# Patient Record
Sex: Female | Born: 1975 | Race: Black or African American | Hispanic: No | Marital: Single | Smoking: Never smoker
Health system: Southern US, Community
[De-identification: ages and names within clinical notes are randomized; demographics above are authoritative.]

---

## 2016-07-18 ENCOUNTER — Other Ambulatory Visit: Payer: Self-pay | Admitting: Obstetrics and Gynecology

## 2016-07-18 DIAGNOSIS — Z1231 Encounter for screening mammogram for malignant neoplasm of breast: Secondary | ICD-10-CM

## 2016-07-23 ENCOUNTER — Other Ambulatory Visit: Payer: Self-pay | Admitting: Orthopedic Surgery

## 2016-07-23 ENCOUNTER — Other Ambulatory Visit (HOSPITAL_COMMUNITY): Payer: Self-pay | Admitting: Orthopedic Surgery

## 2016-07-23 DIAGNOSIS — M5416 Radiculopathy, lumbar region: Secondary | ICD-10-CM

## 2016-07-30 ENCOUNTER — Ambulatory Visit (HOSPITAL_COMMUNITY): Admission: RE | Admit: 2016-07-30 | Payer: 59 | Source: Ambulatory Visit

## 2016-08-21 ENCOUNTER — Encounter: Payer: Self-pay | Admitting: Radiology

## 2016-08-21 ENCOUNTER — Ambulatory Visit
Admission: RE | Admit: 2016-08-21 | Discharge: 2016-08-21 | Disposition: A | Discharge status: Home / Self Care | Payer: 59 | Source: Ambulatory Visit | Attending: Obstetrics and Gynecology | Admitting: Obstetrics and Gynecology

## 2016-08-21 DIAGNOSIS — Z1231 Encounter for screening mammogram for malignant neoplasm of breast: Secondary | ICD-10-CM | POA: Insufficient documentation

## 2016-08-26 ENCOUNTER — Other Ambulatory Visit: Payer: Self-pay | Admitting: Obstetrics and Gynecology

## 2016-08-26 DIAGNOSIS — R928 Other abnormal and inconclusive findings on diagnostic imaging of breast: Secondary | ICD-10-CM

## 2016-08-26 DIAGNOSIS — N6489 Other specified disorders of breast: Secondary | ICD-10-CM

## 2016-09-12 ENCOUNTER — Ambulatory Visit: Payer: 59

## 2016-09-12 ENCOUNTER — Other Ambulatory Visit: Payer: 59

## 2016-09-23 ENCOUNTER — Other Ambulatory Visit: Payer: 59

## 2016-09-23 ENCOUNTER — Ambulatory Visit: Payer: 59 | Attending: Obstetrics and Gynecology

## 2017-10-08 IMAGING — MG MM DIGITAL SCREENING BILAT W/ CAD
4 series · 4 of 4 positions shown · non-contrast
Comparison: None.

CLINICAL DATA: Screening. Baseline exam

EXAM:
DIGITAL SCREENING BILATERAL MAMMOGRAM WITH CAD

[R MLO (1 of 2)]
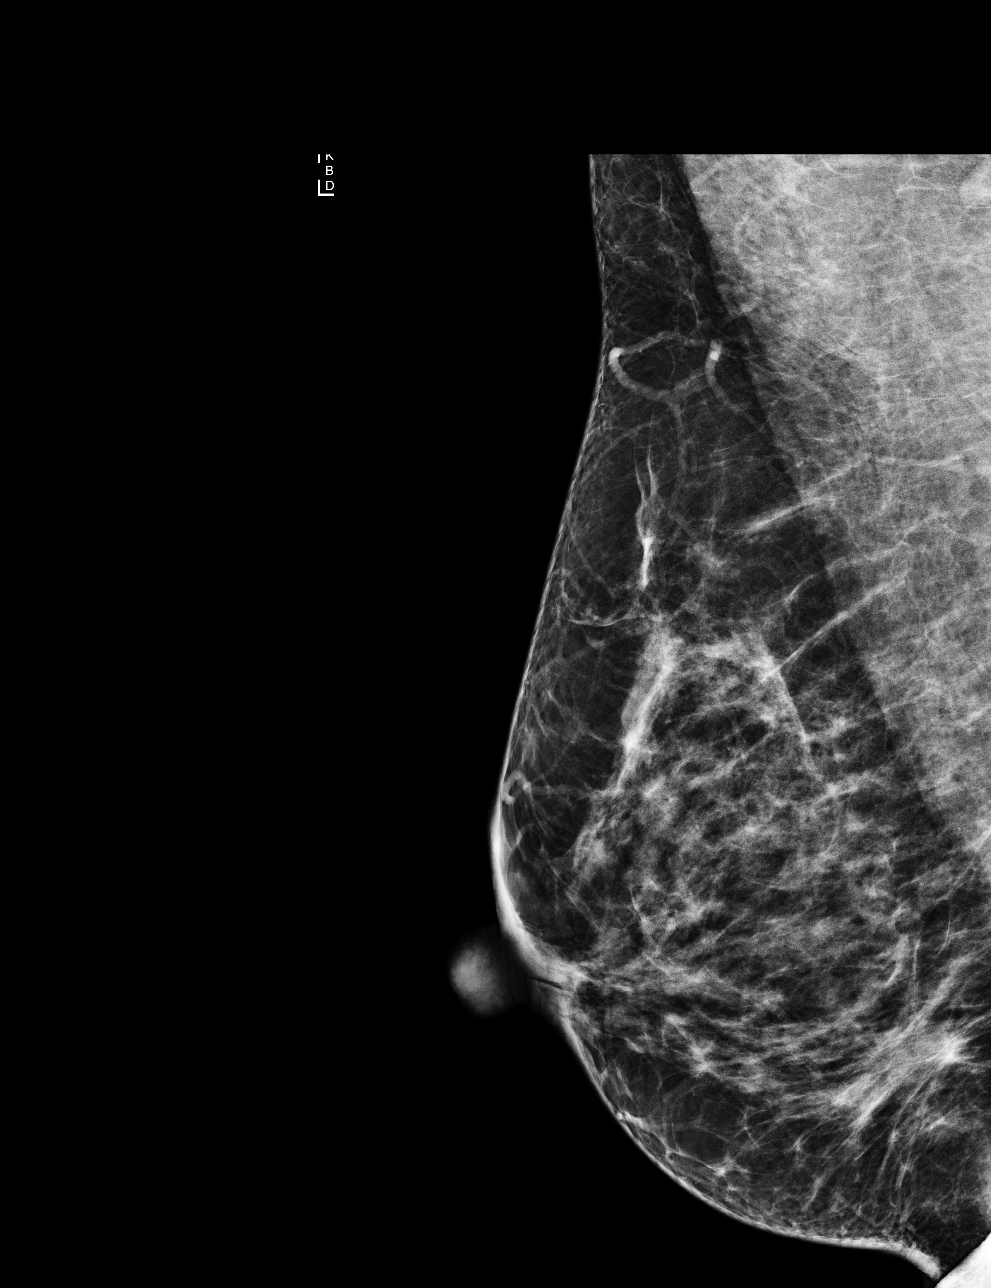

[R CC]
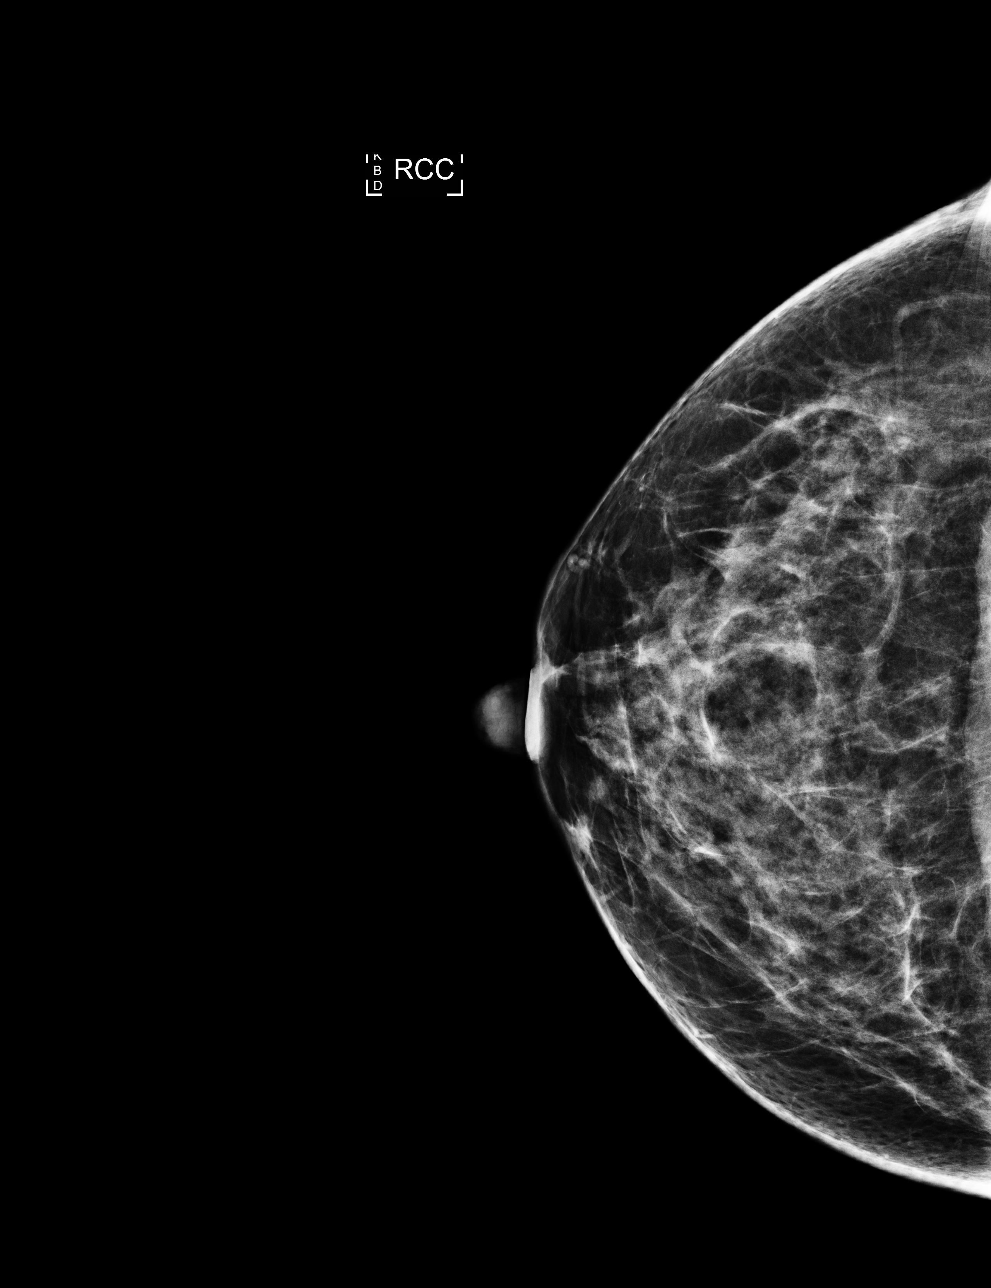

[L MLO]
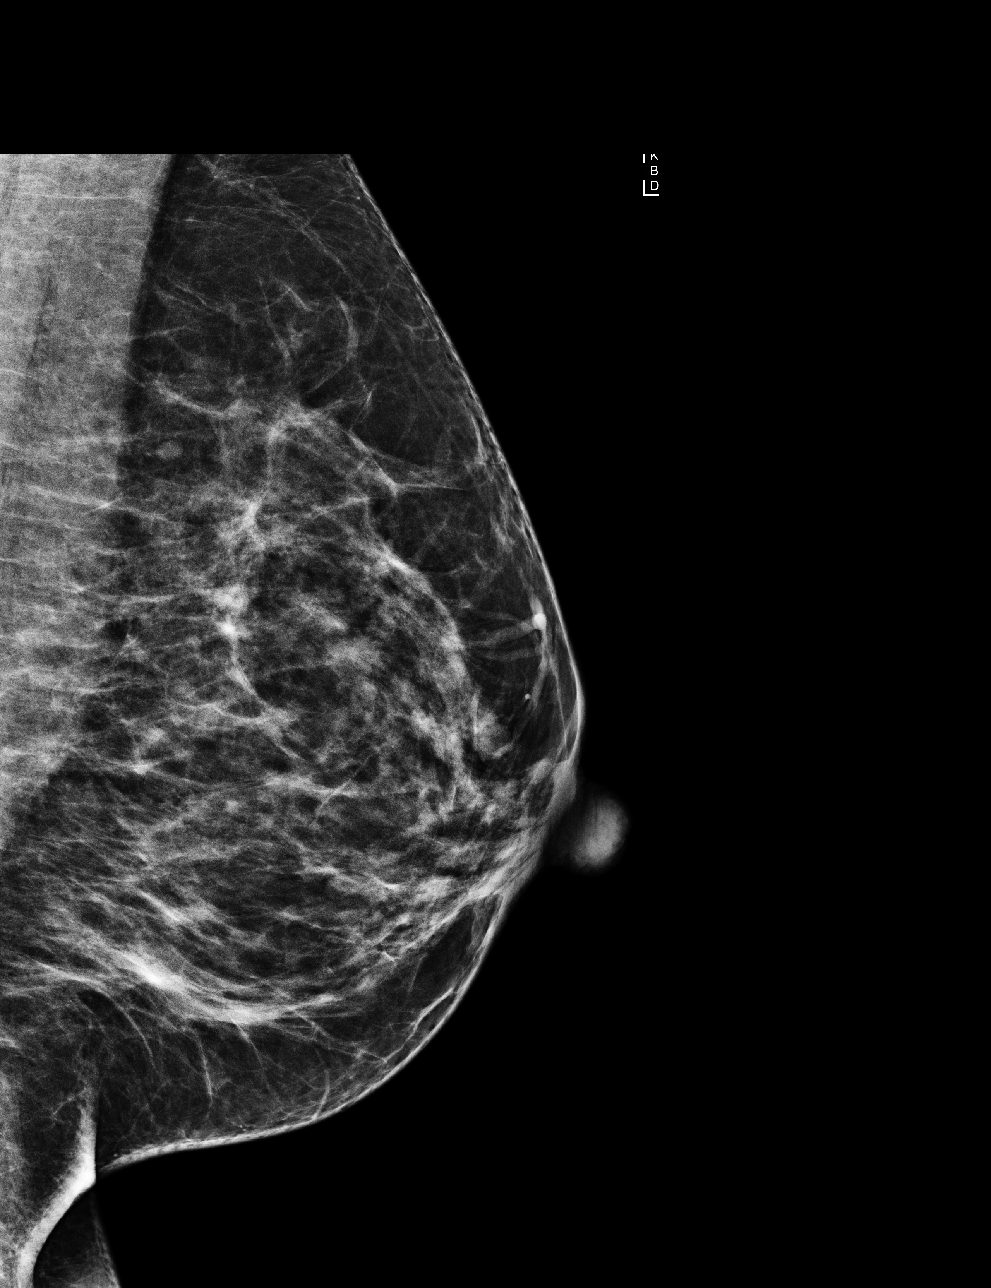

[R MLO (2 of 2)]
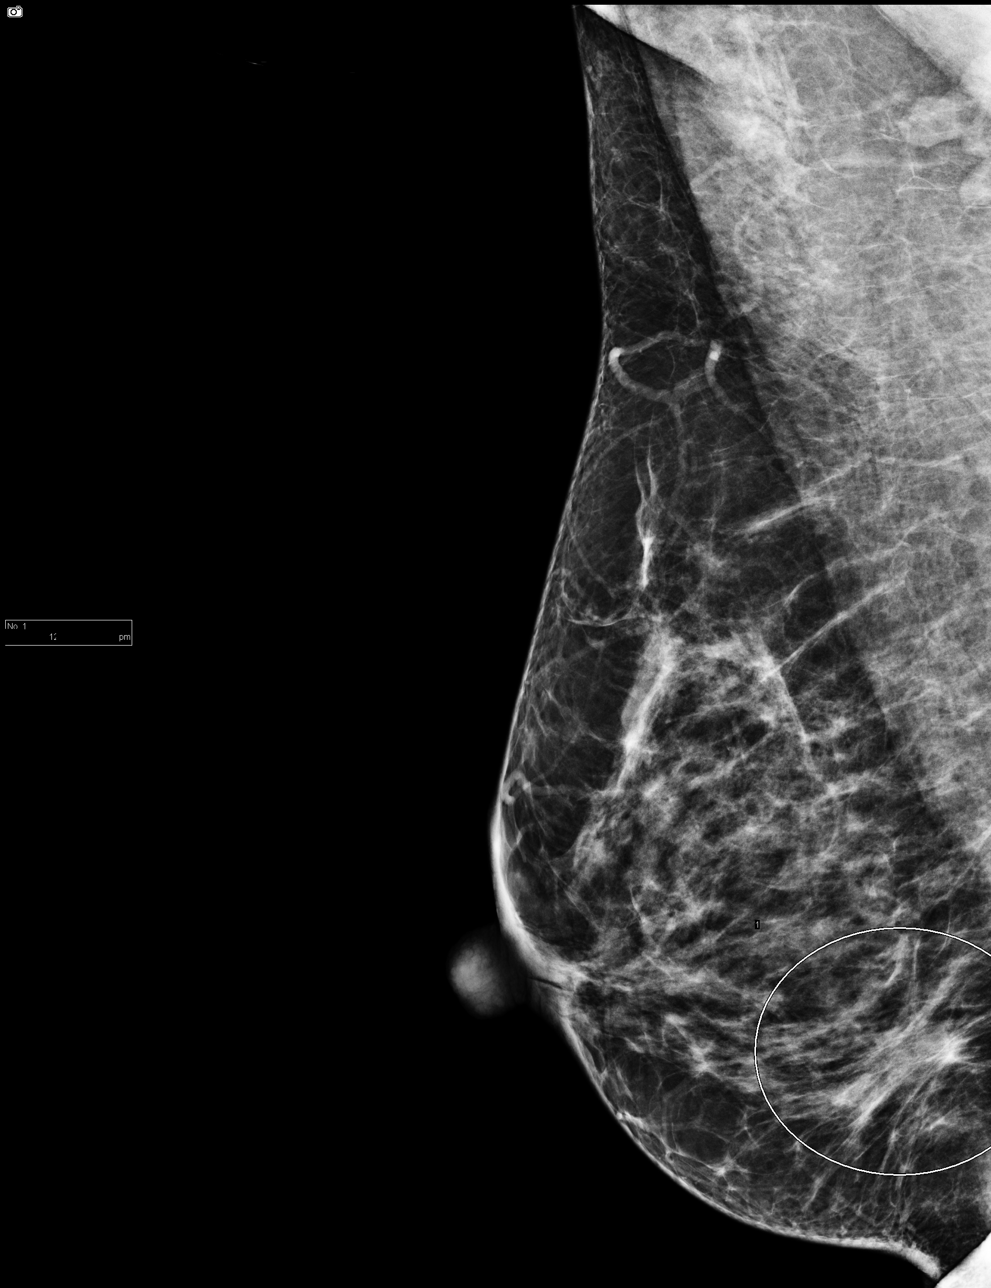

[4 of 4 positions shown; findings below may reference images not displayed]

ACR Breast Density Category c: The breast tissue is heterogeneously
dense, which may obscure small masses.
FINDINGS: In the right breast, a possible asymmetry warrants further
evaluation. In the left breast, no findings suspicious for
malignancy. Images were processed with CAD.
IMPRESSION: Further evaluation is suggested for possible asymmetry in the right
breast.

RECOMMENDATION:
Diagnostic mammogram and possibly ultrasound of the right breast.
(Code:[id number])

The patient will be contacted regarding the findings, and additional
imaging will be scheduled.

BI-RADS CATEGORY  0: Incomplete. Need additional imaging evaluation
and/or prior mammograms for comparison.

## 2018-07-06 ENCOUNTER — Ambulatory Visit
Admission: RE | Admit: 2018-07-06 | Discharge: 2018-07-06 | Disposition: A | Discharge status: Home / Self Care | Payer: Managed Care, Other (non HMO) | Source: Ambulatory Visit | Attending: Chiropractor | Admitting: Chiropractor

## 2018-07-06 ENCOUNTER — Other Ambulatory Visit: Payer: Self-pay | Admitting: Chiropractor

## 2018-07-06 DIAGNOSIS — Z041 Encounter for examination and observation following transport accident: Secondary | ICD-10-CM | POA: Insufficient documentation
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
## 2019-09-13 ENCOUNTER — Ambulatory Visit: Payer: 59 | Attending: Internal Medicine

## 2020-06-21 ENCOUNTER — Other Ambulatory Visit (HOSPITAL_COMMUNITY)
Admission: RE | Admit: 2020-06-21 | Discharge: 2020-06-21 | Disposition: A | Discharge status: Home / Self Care | Payer: Managed Care, Other (non HMO) | Source: Ambulatory Visit | Attending: Obstetrics and Gynecology | Admitting: Obstetrics and Gynecology

## 2020-06-21 ENCOUNTER — Other Ambulatory Visit: Payer: Self-pay

## 2020-06-21 ENCOUNTER — Encounter: Payer: Self-pay | Admitting: Obstetrics and Gynecology

## 2020-06-21 ENCOUNTER — Ambulatory Visit (INDEPENDENT_AMBULATORY_CARE_PROVIDER_SITE_OTHER): Payer: Managed Care, Other (non HMO) | Admitting: Obstetrics and Gynecology

## 2020-06-21 VITALS — BP 126/86 | HR 88 | Wt 196.8 lb

## 2020-06-21 DIAGNOSIS — Z01419 Encounter for gynecological examination (general) (routine) without abnormal findings: Secondary | ICD-10-CM | POA: Diagnosis not present

## 2020-06-21 DIAGNOSIS — Z124 Encounter for screening for malignant neoplasm of cervix: Secondary | ICD-10-CM | POA: Diagnosis present

## 2020-06-21 DIAGNOSIS — R140 Abdominal distension (gaseous): Secondary | ICD-10-CM

## 2020-06-21 DIAGNOSIS — R635 Abnormal weight gain: Secondary | ICD-10-CM

## 2020-06-21 NOTE — Progress Notes (Signed)
HPI:      [name] is a 44 y.o. No obstetric history on file. who LMP was No LMP recorded.  Subjective:   She presents today for her annual examination.  She says she has not had an annual examination or Pap smear in more than 3 years.  She reports that over the last year she has gained 40 pounds but reports that she has not changed her activity or her diet during that time.  She also complains of abdominal bloating and occasional pelvic discomfort.  She has no pain with intercourse menses bowel movements or urination. She is using condoms for birth control and having normal regular monthly menses.    Hx: The following portions of the patient's history were reviewed and updated as appropriate:             She  has no past medical history on file. She does not have a problem list on file. She  has no past surgical history on file. Her family history includes AAA (abdominal aortic aneurysm) in her mother; Breast cancer in her paternal aunt, paternal aunt, and paternal grandmother. She  reports that she has never smoked. She has never used smokeless tobacco. She reports current alcohol use. She reports that she does not use drugs. She has a current medication list which includes the following prescription(s): aspirin ec, cyanocobalamin, multi-vitamin, and vitamin e. She has No Known Allergies.       Review of Systems:  Review of Systems  Constitutional: Denied constitutional symptoms, night sweats, recent illness, fatigue, fever, insomnia and weight loss.  Eyes: Denied eye symptoms, eye pain, photophobia, vision change and visual disturbance.  Ears/Nose/Throat/Neck: Denied ear, nose, throat or neck symptoms, hearing loss, nasal discharge, sinus congestion and sore throat.  Cardiovascular: Denied cardiovascular symptoms, arrhythmia, chest pain/pressure, edema, exercise intolerance, orthopnea and palpitations.  Respiratory: Denied pulmonary symptoms, asthma, pleuritic pain, productive  sputum, cough, dyspnea and wheezing.  Gastrointestinal: Denied, gastro-esophageal reflux, melena, nausea and vomiting.  Genitourinary: See HPI for additional information.  Musculoskeletal: Denied musculoskeletal symptoms, stiffness, swelling, muscle weakness and myalgia.  Dermatologic: Denied dermatology symptoms, rash and scar.  Neurologic: Denied neurology symptoms, dizziness, headache, neck pain and syncope.  Psychiatric: Denied psychiatric symptoms, anxiety and depression.  Endocrine: Denied endocrine symptoms including hot flashes and night sweats.   Meds:   Current Outpatient Medications on File Prior to Visit  Medication Sig Dispense Refill  . aspirin EC 81 MG tablet Take 81 mg by mouth daily. Swallow whole.    . cyanocobalamin 100 MCG tablet Take by mouth.    . Multiple Vitamin (MULTI-VITAMIN) tablet Take 1 tablet by mouth daily.    . vitamin E 180 MG (400 UNITS) capsule Take by mouth.     No current facility-administered medications on file prior to visit.       Upstream - 06/21/20 1116      Pregnancy Intention Screening   Does the patient want to become pregnant in the next year? No    Does the patient's partner want to become pregnant in the next year? No    Would the patient like to discuss contraceptive options today? No      Contraception Wrap Up   Current Method No Contraceptive Precautions    End Method No Contraception Precautions    Contraception Counseling Provided Yes          The pregnancy intention screening data noted above was reviewed. Potential methods of contraception were discussed. The patient  elected to proceed with Female Condom.     Objective:     Vitals:   06/21/20 1108  BP: 126/86  Pulse: 88    Filed Weights   06/21/20 1108  Weight: 196 lb 12.8 oz (89.3 kg)              Physical examination General NAD, Conversant  HEENT Atraumatic; Op clear with mmm.  Normo-cephalic. Pupils reactive. Anicteric sclerae  Thyroid/Neck Smooth  without nodularity or enlargement. Normal ROM.  Neck Supple.  Skin No rashes, lesions or ulceration. Normal palpated skin turgor. No nodularity.  Breasts: No masses or discharge.  Symmetric.  No axillary adenopathy.  Lungs: Clear to auscultation.No rales or wheezes. Normal Respiratory effort, no retractions.  Heart: NSR.  No murmurs or rubs appreciated. No periferal edema  Abdomen: Soft.  Non-tender.  No masses.  No HSM. No hernia  Extremities: Moves all appropriately.  Normal ROM for age. No lymphadenopathy.  Neuro: Oriented to PPT.  Normal mood. Normal affect.     Pelvic:   Vulva: Normal appearance.  No lesions.  Vagina: No lesions or abnormalities noted.  Support: Normal pelvic support.  Urethra No masses tenderness or scarring.  Meatus Normal size without lesions or prolapse.  Cervix: Normal appearance.  No lesions.  Anus: Normal exam.  No lesions.  Perineum: Normal exam.  No lesions.        Bimanual   Uterus: Normal size.  Non-tender.  Mobile.  AV.  Adnexae: No masses.  Non-tender to palpation.  Cul-de-sac: Negative for abnormality.      Assessment:    No obstetric history on file. There are no problems to display for this patient.    1. Well woman exam with routine gynecological exam   2. Unexplained weight gain   3. Abdominal bloating     Nothing found on her exam to explain the increased weight gain or bloating.   Plan:            1.  Basic Screening Recommendations The basic screening recommendations for asymptomatic women were discussed with the patient during her visit.  The age-appropriate recommendations were discussed with her and the rational for the tests reviewed.  When I am informed by the patient that another primary care physician has previously obtained the age-appropriate tests and they are up-to-date, only outstanding tests are ordered and referrals given as necessary.  Abnormal results of tests will be discussed with her when all of her results are  completed.  Routine preventative health maintenance measures emphasized: Exercise/Diet/Weight control, Tobacco Warnings, Alcohol/Substance use risks and Stress Management Pap performed today -mammogram ordered -patient to return for fasting blood work specifically to include TSH 2.  Pelvic ultrasound for increased weight gain and bloating  Orders Orders Placed This Encounter  Procedures  . US PELVIS TRANSVAGINAL NON-OB (TV ONLY)  . US PELVIS (TRANSABDOMINAL ONLY)  . Hemoglobin A1c  . Lipid panel  . TSH    No orders of the defined types were placed in this encounter.           F/U  Return in about 2 weeks (around 07/05/2020).  Elonda Husky, M.D. 06/21/2020 11:52 AM

## 2020-06-21 NOTE — Addendum Note (Signed)
Addended by: Dorian Pod on: 06/21/2020 01:47 PM   Modules accepted: Orders

## 2020-06-22 ENCOUNTER — Other Ambulatory Visit: Payer: Managed Care, Other (non HMO)

## 2020-06-23 LAB — LIPID PANEL
Chol/HDL Ratio: 4.7 ratio — ABNORMAL HIGH (ref 0.0–4.4)
Cholesterol, Total: 150 mg/dL (ref 100–199)
HDL: 32 mg/dL — ABNORMAL LOW (ref >39)
LDL Chol Calc (NIH): 98 mg/dL (ref 0–99)
Triglycerides: 110 mg/dL (ref 0–149)
VLDL Cholesterol Cal: 20 mg/dL (ref 5–40)

## 2020-06-23 LAB — TSH: TSH: 5.28 u[IU]/mL — ABNORMAL HIGH (ref 0.450–4.500)

## 2020-06-23 LAB — HEMOGLOBIN A1C
Est. average glucose Bld gHb Est-mCnc: 111 mg/dL
Hgb A1c MFr Bld: 5.5 % (ref 4.8–5.6)

## 2020-06-26 ENCOUNTER — Ambulatory Visit (INDEPENDENT_AMBULATORY_CARE_PROVIDER_SITE_OTHER): Payer: Managed Care, Other (non HMO)

## 2020-06-26 ENCOUNTER — Other Ambulatory Visit: Payer: Self-pay

## 2020-06-26 ENCOUNTER — Telehealth: Payer: Self-pay

## 2020-06-26 DIAGNOSIS — R635 Abnormal weight gain: Secondary | ICD-10-CM | POA: Diagnosis not present

## 2020-06-26 DIAGNOSIS — R140 Abdominal distension (gaseous): Secondary | ICD-10-CM

## 2020-06-26 LAB — CYTOLOGY - PAP
Comment: NEGATIVE
Diagnosis: NEGATIVE
Diagnosis: REACTIVE
High risk HPV: NEGATIVE

## 2020-06-26 NOTE — Telephone Encounter (Signed)
Pt was checking out she said that [place] needs a bilateral breast ultrasound  Order sent over for this pt.

## 2020-06-27 ENCOUNTER — Telehealth: Payer: Self-pay

## 2020-06-27 NOTE — Telephone Encounter (Signed)
Patient informed of test results per Dr Logan Bores- appointment for thyroid panel scheduled for 06/28/20 @ 815.

## 2020-06-28 ENCOUNTER — Other Ambulatory Visit: Payer: Self-pay

## 2020-06-28 ENCOUNTER — Other Ambulatory Visit: Payer: Managed Care, Other (non HMO)

## 2020-06-28 DIAGNOSIS — R7989 Other specified abnormal findings of blood chemistry: Secondary | ICD-10-CM

## 2020-06-29 LAB — THYROID PANEL WITH TSH
Free Thyroxine Index: 1.9 (ref 1.2–4.9)
T3 Uptake Ratio: 27 % (ref 24–39)
T4, Total: 7.2 ug/dL (ref 4.5–12.0)
TSH: 2.66 u[IU]/mL (ref 0.450–4.500)

## 2020-07-04 ENCOUNTER — Other Ambulatory Visit: Payer: Self-pay | Admitting: Obstetrics and Gynecology

## 2020-07-04 DIAGNOSIS — N6489 Other specified disorders of breast: Secondary | ICD-10-CM

## 2020-07-05 ENCOUNTER — Encounter: Payer: Self-pay | Admitting: Obstetrics and Gynecology

## 2020-07-05 ENCOUNTER — Other Ambulatory Visit: Payer: Self-pay

## 2020-07-05 ENCOUNTER — Other Ambulatory Visit: Payer: Managed Care, Other (non HMO)

## 2020-07-05 ENCOUNTER — Ambulatory Visit (INDEPENDENT_AMBULATORY_CARE_PROVIDER_SITE_OTHER): Payer: Managed Care, Other (non HMO) | Admitting: Obstetrics and Gynecology

## 2020-07-05 VITALS — BP 115/80 | HR 67 | Ht 68.0 in | Wt 197.8 lb

## 2020-07-05 DIAGNOSIS — R635 Abnormal weight gain: Secondary | ICD-10-CM

## 2020-07-05 DIAGNOSIS — R140 Abdominal distension (gaseous): Secondary | ICD-10-CM

## 2020-07-05 NOTE — Progress Notes (Signed)
HPI:      Mariah Lee is a 44 y.o. No obstetric history on file. who LMP was Patient's last menstrual period was 06/26/2020.  Subjective:   She presents today to discuss her lab work and her ultrasound results.  Her TSH was initially elevated but when the entire panel was repeated.  It was found to be normal.  Her thyroid hormone was also normal. She was complaining of increased weight gain and abdominal bloating and so she underwent an ultrasound.  See findings below.    Hx: The following portions of the patient's history were reviewed and updated as appropriate:             She  has no past medical history on file. She does not have a problem list on file. She  has no past surgical history on file. Her family history includes AAA (abdominal aortic aneurysm) in her mother; Breast cancer in her paternal aunt, paternal aunt, and paternal grandmother. She  reports that she has never smoked. She has never used smokeless tobacco. She reports current alcohol use. She reports that she does not use drugs. She has a current medication list which includes the following prescription(s): aspirin ec, cyanocobalamin, multi-vitamin, and vitamin e. She has No Known Allergies.       Review of Systems:  Review of Systems  Constitutional: Denied constitutional symptoms, night sweats, recent illness, fatigue, fever, insomnia and weight loss.  Eyes: Denied eye symptoms, eye pain, photophobia, vision change and visual disturbance.  Ears/Nose/Throat/Neck: Denied ear, nose, throat or neck symptoms, hearing loss, nasal discharge, sinus congestion and sore throat.  Cardiovascular: Denied cardiovascular symptoms, arrhythmia, chest pain/pressure, edema, exercise intolerance, orthopnea and palpitations.  Respiratory: Denied pulmonary symptoms, asthma, pleuritic pain, productive sputum, cough, dyspnea and wheezing.  Gastrointestinal: Denied, gastro-esophageal reflux, melena, nausea and vomiting.  Genitourinary:  Denied genitourinary symptoms including symptomatic vaginal discharge, pelvic relaxation issues, and urinary complaints.  Musculoskeletal: Denied musculoskeletal symptoms, stiffness, swelling, muscle weakness and myalgia.  Dermatologic: Denied dermatology symptoms, rash and scar.  Neurologic: Denied neurology symptoms, dizziness, headache, neck pain and syncope.  Psychiatric: Denied psychiatric symptoms, anxiety and depression.  Endocrine: Denied endocrine symptoms including hot flashes and night sweats.   Meds:   Current Outpatient Medications on File Prior to Visit  Medication Sig Dispense Refill  . aspirin EC 81 MG tablet Take 81 mg by mouth daily. Swallow whole.    . cyanocobalamin 100 MCG tablet Take by mouth.    . Multiple Vitamin (MULTI-VITAMIN) tablet Take 1 tablet by mouth daily.    . vitamin E 180 MG (400 UNITS) capsule Take by mouth.     No current facility-administered medications on file prior to visit.          Objective:     Vitals:   07/05/20 1042  BP: 115/80  Pulse: 67   Filed Weights   07/05/20 1042  Weight: 197 lb 12.8 oz (89.7 kg)              Ultrasound findings reviewed directly with the patient  Assessment:    No obstetric history on file. There are no problems to display for this patient.    1. Unexplained weight gain   2. Abdominal bloating     Patient has 2 small fibroids noted at ultrasound but an otherwise normal pelvic exam.  She states that she has known about the fibroids for many years and in fact they seem smaller now than when she was first diagnosed. (  She was first diagnosed during pregnancy)  She continues to have normal regular menses.   Plan:            1.  Normal ultrasound  2.  Initially abnormal TSH but a normal complete thyroid panel subsequent to that.  I have reassured her regarding the pelvic exam and thyroid testing.  There is nothing further to do at this time.  All questions answered including questions about  endometriosis and possible menopause.  Orders No orders of the defined types were placed in this encounter.   No orders of the defined types were placed in this encounter.     F/U  Return in about 1 year (around 07/05/2021) for Annual Physical. I spent 16 minutes involved in the care of this patient preparing to see the patient by obtaining and reviewing her medical history (including labs, imaging tests and prior procedures), documenting clinical information in the electronic health record (EHR), counseling and coordinating care plans, writing and sending prescriptions, ordering tests or procedures and directly communicating with the patient by discussing pertinent items from her history and physical exam as well as detailing my assessment and plan as noted above so that she has an informed understanding.  All of her questions were answered.  Elonda Husky, M.D. 07/05/2020 11:26 AM

## 2020-08-07 DIAGNOSIS — Z79899 Other long term (current) drug therapy: Secondary | ICD-10-CM | POA: Diagnosis not present

## 2020-08-07 DIAGNOSIS — K0889 Other specified disorders of teeth and supporting structures: Secondary | ICD-10-CM | POA: Diagnosis not present

## 2020-08-07 DIAGNOSIS — K0381 Cracked tooth: Secondary | ICD-10-CM | POA: Diagnosis not present

## 2020-12-13 DIAGNOSIS — R946 Abnormal results of thyroid function studies: Secondary | ICD-10-CM | POA: Diagnosis not present

## 2020-12-13 DIAGNOSIS — Z3200 Encounter for pregnancy test, result unknown: Secondary | ICD-10-CM | POA: Diagnosis not present

## 2020-12-13 DIAGNOSIS — Z3201 Encounter for pregnancy test, result positive: Secondary | ICD-10-CM | POA: Diagnosis not present

## 2021-01-05 ENCOUNTER — Emergency Department
Admission: EM | Admit: 2021-01-05 | Discharge: 2021-01-05 | Disposition: A | Discharge status: Home / Self Care | Payer: Medicaid Other | Attending: Emergency Medicine | Admitting: Emergency Medicine

## 2021-01-05 ENCOUNTER — Emergency Department: Payer: Medicaid Other

## 2021-01-05 ENCOUNTER — Encounter: Payer: Self-pay | Admitting: Emergency Medicine

## 2021-01-05 ENCOUNTER — Other Ambulatory Visit: Payer: Self-pay

## 2021-01-05 DIAGNOSIS — Z3A10 10 weeks gestation of pregnancy: Secondary | ICD-10-CM | POA: Diagnosis not present

## 2021-01-05 DIAGNOSIS — O209 Hemorrhage in early pregnancy, unspecified: Secondary | ICD-10-CM | POA: Diagnosis not present

## 2021-01-05 DIAGNOSIS — N939 Abnormal uterine and vaginal bleeding, unspecified: Secondary | ICD-10-CM | POA: Diagnosis not present

## 2021-01-05 DIAGNOSIS — O4690 Antepartum hemorrhage, unspecified, unspecified trimester: Secondary | ICD-10-CM

## 2021-01-05 DIAGNOSIS — Z7982 Long term (current) use of aspirin: Secondary | ICD-10-CM | POA: Diagnosis not present

## 2021-01-05 DIAGNOSIS — O3411 Maternal care for benign tumor of corpus uteri, first trimester: Secondary | ICD-10-CM | POA: Diagnosis not present

## 2021-01-05 DIAGNOSIS — Z3A01 Less than 8 weeks gestation of pregnancy: Secondary | ICD-10-CM | POA: Diagnosis not present

## 2021-01-05 LAB — CBC WITH DIFFERENTIAL/PLATELET
Abs Immature Granulocytes: 0.03 10*3/uL (ref 0.00–0.07)
Basophils Absolute: 0 10*3/uL (ref 0.0–0.1)
Basophils Relative: 1 %
Eosinophils Absolute: 0.2 10*3/uL (ref 0.0–0.5)
Eosinophils Relative: 3 %
HCT: 37.5 % (ref 36.0–46.0)
Hemoglobin: 12.2 g/dL (ref 12.0–15.0)
Immature Granulocytes: 0 %
Lymphocytes Relative: 26 %
Lymphs Abs: 1.9 10*3/uL (ref 0.7–4.0)
MCH: 29 pg (ref 26.0–34.0)
MCHC: 32.5 g/dL (ref 30.0–36.0)
MCV: 89.3 fL (ref 80.0–100.0)
Monocytes Absolute: 0.5 10*3/uL (ref 0.1–1.0)
Monocytes Relative: 7 %
Neutro Abs: 4.5 10*3/uL (ref 1.7–7.7)
Neutrophils Relative %: 63 %
Platelets: 335 10*3/uL (ref 150–400)
RBC: 4.2 MIL/uL (ref 3.87–5.11)
RDW: 14.6 % (ref 11.5–15.5)
WBC: 7.2 10*3/uL (ref 4.0–10.5)
nRBC: 0 % (ref 0.0–0.2)

## 2021-01-05 LAB — RHOGAM INJECTION

## 2021-01-05 LAB — HCG, QUANTITATIVE, PREGNANCY: hCG, Beta Chain, Quant, S: 15007 m[IU]/mL — ABNORMAL HIGH (ref <5)

## 2021-01-05 LAB — BASIC METABOLIC PANEL
Anion gap: 8 (ref 5–15)
BUN: 11 mg/dL (ref 6–20)
CO2: 25 mmol/L (ref 22–32)
Calcium: 9.1 mg/dL (ref 8.9–10.3)
Chloride: 102 mmol/L (ref 98–111)
Creatinine, Ser: 0.82 mg/dL (ref 0.44–1.00)
GFR, Estimated: >60 mL/min (ref >60)
Glucose, Bld: 88 mg/dL (ref 70–99)
Potassium: 3.5 mmol/L (ref 3.5–5.1)
Sodium: 135 mmol/L (ref 135–145)

## 2021-01-05 LAB — ABO/RH: ABO/RH(D): O NEG

## 2021-01-05 LAB — WET PREP, GENITAL
Clue Cells Wet Prep HPF POC: NONE SEEN
Sperm: NONE SEEN
Trich, Wet Prep: NONE SEEN
Yeast Wet Prep HPF POC: NONE SEEN

## 2021-01-05 LAB — CHLAMYDIA/NGC RT PCR (ARMC ONLY)
Chlamydia Tr: NOT DETECTED
N gonorrhoeae: NOT DETECTED

## 2021-01-05 LAB — SAMPLE TO BLOOD BANK

## 2021-01-05 LAB — ANTIBODY SCREEN: Antibody Screen: NEGATIVE

## 2021-01-05 NOTE — ED Notes (Signed)
Patient transported to Ultrasound 

## 2021-01-05 NOTE — ED Notes (Signed)
Pelvic exam by Dr. Larinda Buttery with this RN at bedside at this time.

## 2021-01-05 NOTE — Discharge Instructions (Signed)
Please schedule follow-up with your OB/GYN at [facility] for repeat ultrasound in the next 10 to 14 days.  If you have worsening bleeding or significant pain, you may return to the ER for reevaluation.

## 2021-01-05 NOTE — ED Triage Notes (Signed)
Pt to ED via POV stating that she is [redacted] weeks pregnant. Pt stated having some vaginal bleeding yesterday. Pt states that the blood is pink, pt is not passing any blood clots, pt states that she did have some cramping in her lower back yesterday but that has since resolved. Pt is in NAD at this time. Pt is G5P1

## 2021-01-05 NOTE — ED Provider Notes (Signed)
[facility] Emergency Department Provider Note   ____________________________________________   Event Date/Time   First MD Initiated Contact with Patient 01/05/21 1109     (approximate)  I have reviewed the triage vital signs and the nursing notes.   HISTORY  Chief Complaint Vaginal Bleeding    HPI Mariah Lee is a 45 y.o. female, G5 P1031 at approximately 10 weeks of pregnancy who presents to the ED complaining of vaginal bleeding.  Patient reports that she started to notice some light pink vaginal bleeding yesterday with some crampy discomfort in her lower back.  Pain has resolved and she denies any pain in her abdomen, but she continues to deal with light bleeding.  She reports having to change her panty liner every 3 hours, but she has not noticed the passage of any clots or tissue.  She has not yet seen an OB/GYN this pregnancy, does have an ultrasound scheduled at [facility] for next week.  She denies any fevers, dysuria, flank pain, chest pain, or shortness of breath.        History reviewed. No pertinent past medical history.  There are no problems to display for this patient.   History reviewed. No pertinent surgical history.  Prior to Admission medications   Medication Sig Start Date End Date Taking? Authorizing Provider  Multiple Vitamin (MULTI-VITAMIN) tablet Take 1 tablet by mouth daily.   Yes [provider]  vitamin E 180 MG (400 UNITS) capsule Take by mouth.   Yes [provider]  aspirin EC 81 MG tablet Take 81 mg by mouth daily. Swallow whole.    [provider]  cyanocobalamin 100 MCG tablet Take by mouth.    [provider]    Allergies Ciprofloxacin and Clindamycin  Family History  Problem Relation Age of Onset  . Breast cancer Paternal Aunt   . Breast cancer Paternal Grandmother   . Breast cancer Paternal Aunt   . AAA (abdominal aortic aneurysm) Mother     Social History Social History    Tobacco Use  . Smoking status: Never Smoker  . Smokeless tobacco: Never Used  Substance Use Topics  . Alcohol use: Not Currently  . Drug use: Never    Review of Systems  Constitutional: No fever/chills Eyes: No visual changes. ENT: No sore throat. Cardiovascular: Denies chest pain. Respiratory: Denies shortness of breath. Gastrointestinal: No abdominal pain.  No nausea, no vomiting.  No diarrhea.  No constipation. Genitourinary: Negative for dysuria.  Positive for vaginal bleeding. Musculoskeletal: Negative for back pain. Skin: Negative for rash. Neurological: Negative for headaches, focal weakness or numbness.  ____________________________________________   PHYSICAL EXAM:  VITAL SIGNS: ED Triage Vitals  Enc Vitals Group     BP 01/05/21 0941 110/68     Pulse Rate 01/05/21 0941 78     Resp 01/05/21 0941 16     Temp 01/05/21 0941 98.7 F (37.1 C)     Temp Source 01/05/21 0941 Oral     SpO2 01/05/21 0941 100 %     Weight 01/05/21 0942 200 lb (90.7 kg)     Height 01/05/21 0942 5\' 8"  (1.727 m)     Head Circumference --      Peak Flow --      Pain Score 01/05/21 0942 0     Pain Loc --      Pain Edu? --      Excl. in GC? --     Constitutional: Alert and oriented. Eyes: Conjunctivae are normal.  Head: Atraumatic. Nose: No congestion/rhinnorhea. Mouth/Throat: Mucous membranes are moist. Neck: Normal ROM Cardiovascular: Normal rate, regular rhythm. Grossly normal heart sounds. Respiratory: Normal respiratory effort.  No retractions. Lungs CTAB. Gastrointestinal: Soft and nontender. No distention. Genitourinary: Closed cervical os with scant bleeding, no cervical motion or adnexal tenderness. Musculoskeletal: No lower extremity tenderness nor edema. Neurologic:  Normal speech and language. No gross focal neurologic deficits are appreciated. Skin:  Skin is warm, dry and intact. No rash noted. Psychiatric: Mood and affect are normal. Speech and behavior are  normal.  ____________________________________________   LABS (all labs ordered are listed, but only abnormal results are displayed)  Labs Reviewed  WET PREP, GENITAL - Abnormal; Notable for the following components:      Result Value   WBC, Wet Prep HPF POC FEW (*)    All other components within normal limits  HCG, QUANTITATIVE, PREGNANCY - Abnormal; Notable for the following components:   hCG, Beta Chain, Quant, S 15,007 (*)    All other components within normal limits  CHLAMYDIA/NGC RT PCR ([facility] ONLY)  CBC WITH DIFFERENTIAL/PLATELET  BASIC METABOLIC PANEL  POC URINE PREG, ED  ABO/RH  SAMPLE TO BLOOD BANK  ANTIBODY SCREEN  RHOGAM INJECTION    PROCEDURES  Procedure(s) performed (including Critical Care):  Procedures   ____________________________________________   INITIAL IMPRESSION / ASSESSMENT AND PLAN / ED COURSE       45 year old female, G5 P1-0-3-1 at approximately 10 weeks of pregnancy, presents to the ED with vaginal bleeding since yesterday.  Labs thus far are reassuring, H&H is stable and no evidence of significant hemorrhage.  Patient is Rh- and we will give dose of RhoGAM.  We will further assess for ectopic pregnancy or miscarriage with ultrasound.  Pelvic exam shows scant bleeding with closed cervical os and no cervical motion or nasal tenderness.  Ultrasound shows intrauterine gestational sac with septations and not yet fully formed fetal pole, concerning for possible failed pregnancy.  Patient was notified of these findings and counseled to follow-up with her OB/GYN at [facility], she has an appointment for Wednesday of next week.  She was counseled that she will need follow-up ultrasound and she was also counseled to return to the ED for any new or worsening symptoms.  Patient agrees with plan.      ____________________________________________   FINAL CLINICAL IMPRESSION(S) / ED DIAGNOSES  Final diagnoses:  Vaginal bleeding in pregnancy      ED Discharge Orders    None       Note:  This document was prepared using Dragon voice recognition software and may include unintentional dictation errors.   Chesley Noon, MD 01/05/21 [phone]

## 2021-01-05 NOTE — ED Notes (Signed)
Patient returned to ED. Family at bedside.  

## 2021-01-05 NOTE — ED Notes (Signed)
Patient reports she is approximately [redacted] weeks pregnant. Patient reports some spotting today and yesterday. Patient denies pain, cramping, or vaginal discomfort. Patient reports she has not had her first appointment with her established OBGYN at this time.

## 2021-01-05 NOTE — ED Notes (Signed)
Pelvic cart at bedside. 

## 2021-01-05 NOTE — ED Notes (Signed)
Patient is resting comfortably. 

## 2021-01-06 LAB — RHOGAM INJECTION: Unit division: 0

## 2021-01-08 ENCOUNTER — Telehealth: Payer: Self-pay

## 2021-01-08 NOTE — Telephone Encounter (Signed)
Transition Care Management Follow-up Telephone Call  Date of discharge and from where: 004/29/2022 from [facility]  How have you been since you were released from the hospital? Pt stated that she is feeling well and has no questions at this time.   Any questions or concerns? No  Items Reviewed:  Did the pt receive and understand the discharge instructions provided? Yes   Medications obtained and verified? Yes   Other? No   Any new allergies since your discharge? No   Dietary orders reviewed? n/a  Do you have support at home? Yes   Functional Questionnaire: (I = Independent and D = Dependent) ADLs: I  Bathing/Dressing- I  Meal Prep- I  Eating- I  Maintaining continence- I  Transferring/Ambulation- I  Managing Meds- I  Follow up appointments reviewed:   PCP Hospital f/u appt confirmed? No    Specialist Hospital f/u appt confirmed? Yes  Scheduled to see Marcha Solders, MD on 01/16/2021 @ 10:30am.  Are transportation arrangements needed? No   If their condition worsens, is the pt aware to call PCP or go to the Emergency Dept.? Yes  Was the patient provided with contact information for the PCP's office or ED? Yes  Was to pt encouraged to call back with questions or concerns? Yes

## 2021-01-10 DIAGNOSIS — Z13228 Encounter for screening for other metabolic disorders: Secondary | ICD-10-CM | POA: Diagnosis not present

## 2021-01-10 DIAGNOSIS — Z13220 Encounter for screening for lipoid disorders: Secondary | ICD-10-CM | POA: Diagnosis not present

## 2021-01-10 DIAGNOSIS — Z131 Encounter for screening for diabetes mellitus: Secondary | ICD-10-CM | POA: Diagnosis not present

## 2021-01-10 DIAGNOSIS — O360111 Maternal care for anti-D [Rh] antibodies, first trimester, fetus 1: Secondary | ICD-10-CM | POA: Diagnosis not present

## 2021-01-10 DIAGNOSIS — O021 Missed abortion: Secondary | ICD-10-CM | POA: Diagnosis not present

## 2021-01-10 DIAGNOSIS — Z130 Encounter for screening for diseases of the blood and blood-forming organs and certain disorders involving the immune mechanism: Secondary | ICD-10-CM | POA: Diagnosis not present

## 2021-01-10 DIAGNOSIS — Z0183 Encounter for blood typing: Secondary | ICD-10-CM | POA: Diagnosis not present

## 2021-01-10 DIAGNOSIS — O3680X9 Pregnancy with inconclusive fetal viability, other fetus: Secondary | ICD-10-CM | POA: Diagnosis not present

## 2021-01-10 DIAGNOSIS — Z0000 Encounter for general adult medical examination without abnormal findings: Secondary | ICD-10-CM | POA: Diagnosis not present

## 2021-01-15 DIAGNOSIS — Z308 Encounter for other contraceptive management: Secondary | ICD-10-CM | POA: Diagnosis not present

## 2021-01-15 DIAGNOSIS — O039 Complete or unspecified spontaneous abortion without complication: Secondary | ICD-10-CM | POA: Diagnosis not present

## 2021-01-15 DIAGNOSIS — Z3201 Encounter for pregnancy test, result positive: Secondary | ICD-10-CM | POA: Diagnosis not present

## 2021-05-23 DIAGNOSIS — Z03818 Encounter for observation for suspected exposure to other biological agents ruled out: Secondary | ICD-10-CM | POA: Diagnosis not present

## 2021-05-23 DIAGNOSIS — Z20822 Contact with and (suspected) exposure to covid-19: Secondary | ICD-10-CM | POA: Diagnosis not present

## 2021-05-28 DIAGNOSIS — M25522 Pain in left elbow: Secondary | ICD-10-CM | POA: Diagnosis not present

## 2021-05-28 DIAGNOSIS — Z0000 Encounter for general adult medical examination without abnormal findings: Secondary | ICD-10-CM | POA: Diagnosis not present

## 2021-05-28 DIAGNOSIS — M2550 Pain in unspecified joint: Secondary | ICD-10-CM | POA: Diagnosis not present

## 2021-05-28 DIAGNOSIS — M79602 Pain in left arm: Secondary | ICD-10-CM | POA: Diagnosis not present

## 2021-05-28 DIAGNOSIS — M71552 Other bursitis, not elsewhere classified, left hip: Secondary | ICD-10-CM | POA: Diagnosis not present

## 2021-06-08 ENCOUNTER — Inpatient Hospital Stay: Admission: RE | Admit: 2021-06-08 | Payer: Medicaid Other | Source: Ambulatory Visit

## 2021-06-08 ENCOUNTER — Other Ambulatory Visit: Payer: Medicaid Other

## 2021-06-21 ENCOUNTER — Other Ambulatory Visit: Payer: Self-pay | Admitting: Family Medicine

## 2021-06-21 DIAGNOSIS — N63 Unspecified lump in unspecified breast: Secondary | ICD-10-CM

## 2021-10-02 DIAGNOSIS — R922 Inconclusive mammogram: Secondary | ICD-10-CM | POA: Diagnosis not present

## 2021-10-02 DIAGNOSIS — D241 Benign neoplasm of right breast: Secondary | ICD-10-CM | POA: Diagnosis not present

## 2021-10-02 DIAGNOSIS — R928 Other abnormal and inconclusive findings on diagnostic imaging of breast: Secondary | ICD-10-CM | POA: Diagnosis not present

## 2021-10-15 DIAGNOSIS — D249 Benign neoplasm of unspecified breast: Secondary | ICD-10-CM | POA: Diagnosis not present

## 2021-10-15 DIAGNOSIS — Z0000 Encounter for general adult medical examination without abnormal findings: Secondary | ICD-10-CM | POA: Diagnosis not present

## 2021-10-15 DIAGNOSIS — M7712 Lateral epicondylitis, left elbow: Secondary | ICD-10-CM | POA: Diagnosis not present

## 2021-11-13 DIAGNOSIS — E669 Obesity, unspecified: Secondary | ICD-10-CM | POA: Diagnosis not present

## 2021-12-02 DIAGNOSIS — Z20822 Contact with and (suspected) exposure to covid-19: Secondary | ICD-10-CM | POA: Diagnosis not present

## 2021-12-02 DIAGNOSIS — R051 Acute cough: Secondary | ICD-10-CM | POA: Diagnosis not present

## 2021-12-02 DIAGNOSIS — R070 Pain in throat: Secondary | ICD-10-CM | POA: Diagnosis not present

## 2021-12-02 DIAGNOSIS — J029 Acute pharyngitis, unspecified: Secondary | ICD-10-CM | POA: Diagnosis not present

## 2021-12-02 DIAGNOSIS — N390 Urinary tract infection, site not specified: Secondary | ICD-10-CM | POA: Diagnosis not present

## 2021-12-02 DIAGNOSIS — R300 Dysuria: Secondary | ICD-10-CM | POA: Diagnosis not present

## 2022-02-22 IMAGING — US US OB < 14 WEEKS - US OB TV
1 series · 13 of 28 positions shown · non-contrast
Comparison: None.

CLINICAL DATA: Vaginal bleeding

EXAM:
OBSTETRIC <14 WK US AND TRANSVAGINAL OB US
TECHNIQUE: Both transabdominal and transvaginal ultrasound examinations were
performed for complete evaluation of the gestation as well as the
maternal uterus, adnexal regions, and pelvic cul-de-sac.
Transvaginal technique was performed to assess early pregnancy.

[Series 1: us ob comp less 14 wks · 104 acquisitions, 13 frames shown]
[im 4/104]
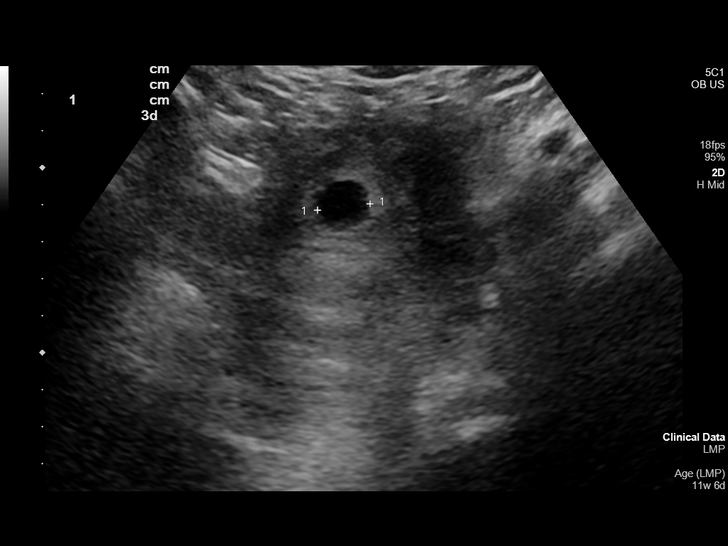
[im 12/104]
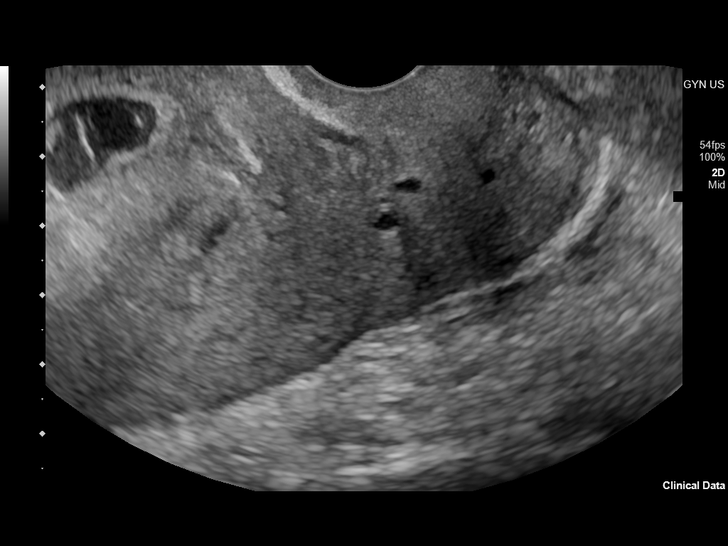
[im 20/104]
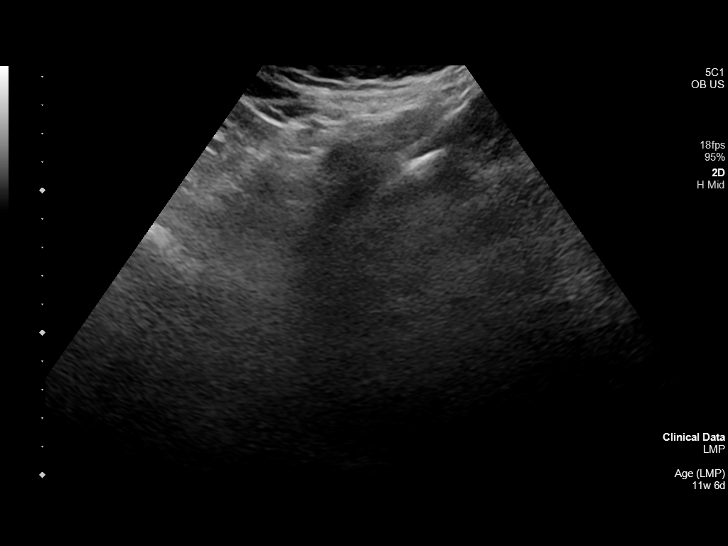
[im 27/104]
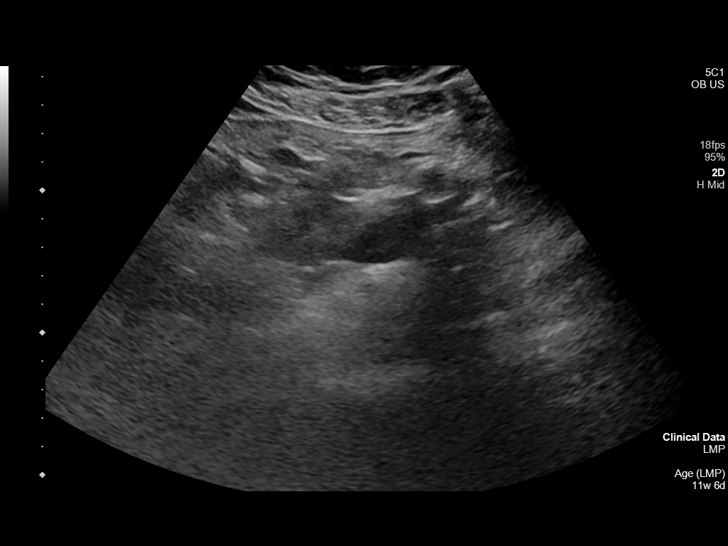
[im 35/104]
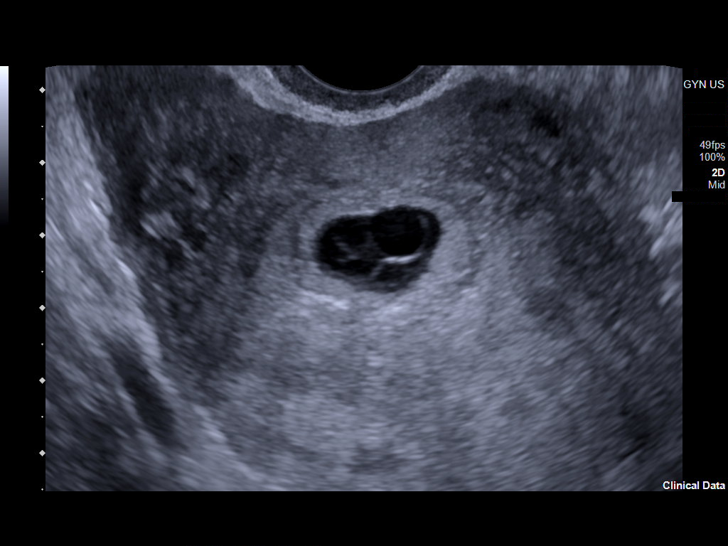
[im 42/104]
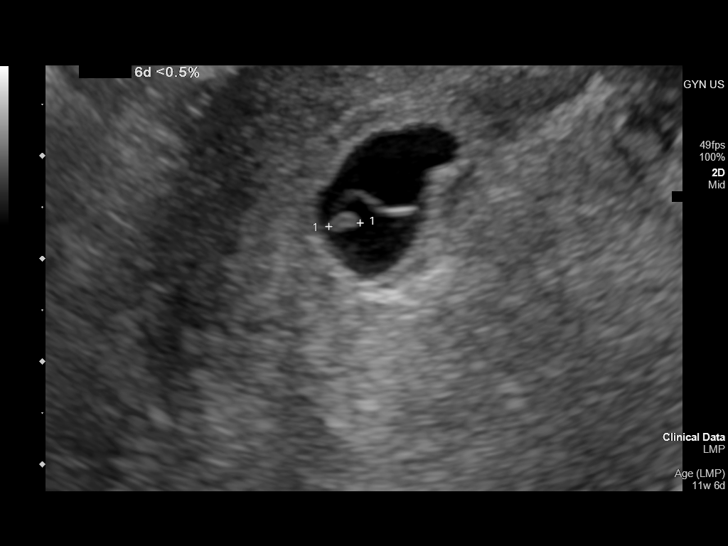
[im 54/104]
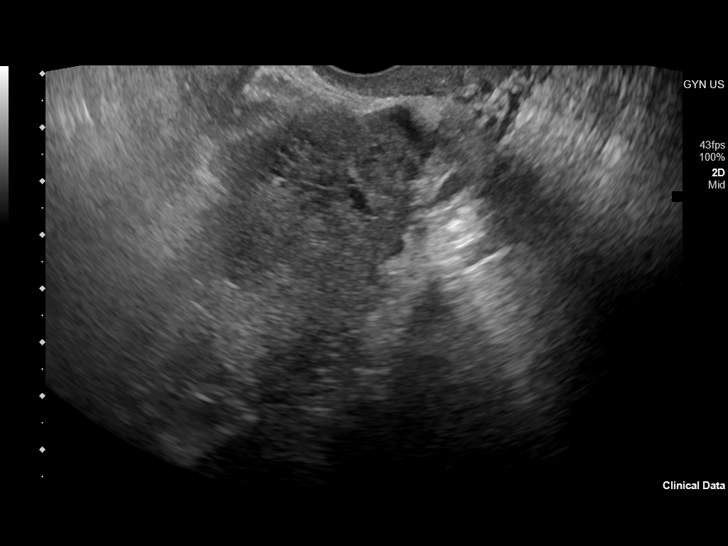
[im 62/104]
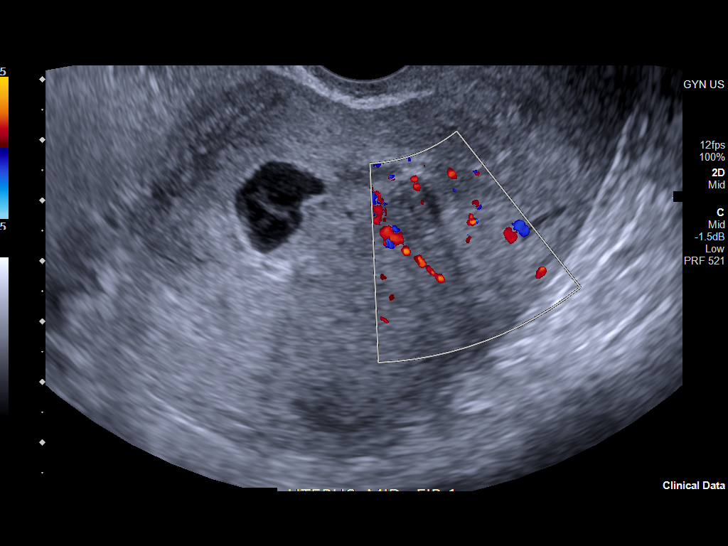
[im 69/104]
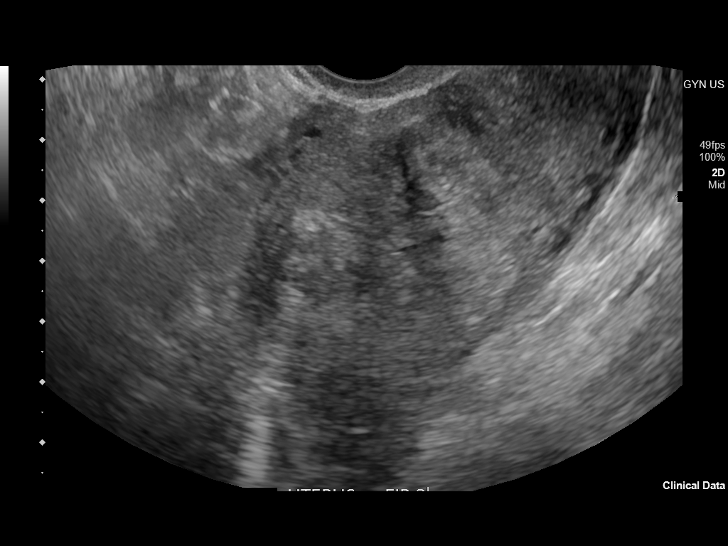
[im 77/104]
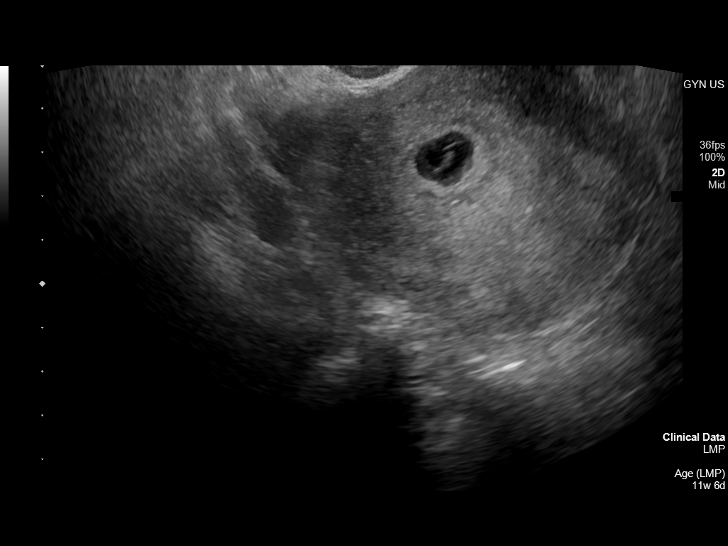
[im 84/104]
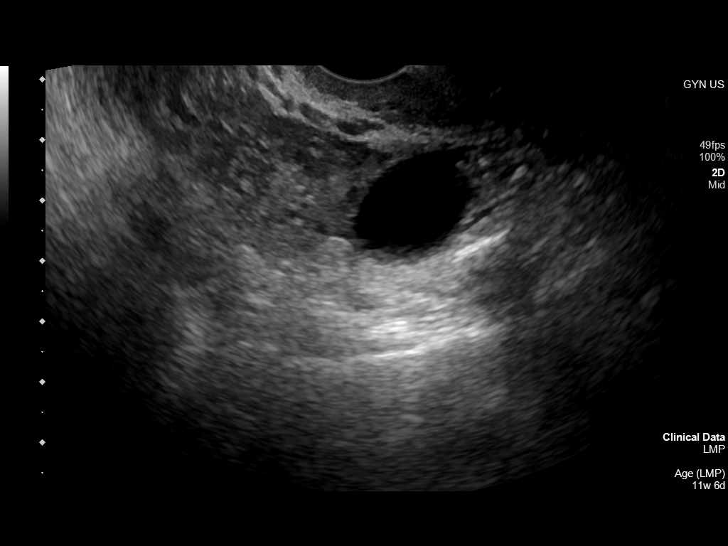
[im 92/104]
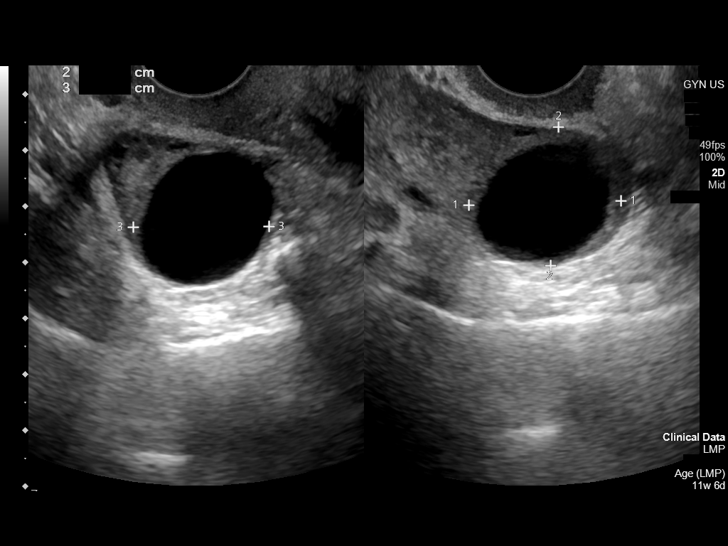
[im 100/104]
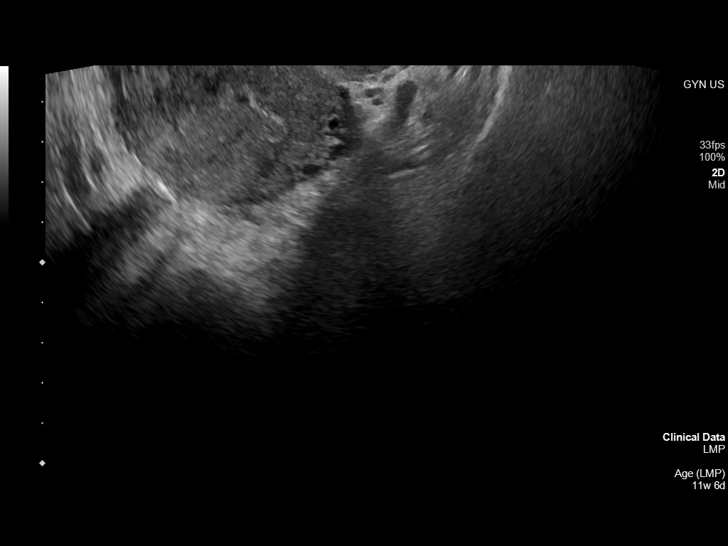

[13 of 28 positions shown; findings below may reference images not displayed]

FINDINGS: Intrauterine gestational sac: Visualized-single. Gestational sac
contains septations.

Yolk sac:  Visualized

Embryo:  Visualized

Cardiac Activity: Not visualized

CRL:  3 mm   5 w   6 d

Subchorionic hemorrhage:  None visualized.

Maternal uterus/adnexae: Leiomyomatous changes noted within the
uterus. In the mid uterus there is a 1.3 x 1.2 x 1.4 cm mixed
echogenicity apparent leiomyoma. A similar appearing apparent
leiomyomas noted in the lower uterine segment region toward the left
measuring 1.4 x 1.5 x 1.5 cm. In the mid fundal region, an apparent
leiomyoma measures 1.8 x 1.5 x 1.4 cm. Cervical os is closed.

Right ovary measures 4.9 x 2.5 x 3.3 cm. Left ovary not well seen by
transabdominal transvaginal technique. No left-sided pelvic mass
evident. There is a presumed corpus luteum on the right measuring
2.7 x 2.3 x 2.3 cm. No free pelvic fluid.
IMPRESSION: 1. There is a 3 mm apparent fetal pole without cardiac activity.
Findings are suspicious but not yet definitive for failed pregnancy.
Recommend follow-up US in 10-14 days for definitive diagnosis. This
recommendation follows SRU consensus guidelines: Diagnostic Criteria
for Nonviable Pregnancy Early in the First Trimester. N Engl J Med
7581; [DATE].

2. Septations noted within the gestational sac. This finding will
warrant close clinical and imaging surveillance.

3. Leiomyomatous uterus with several leiomyomas appreciable. The
echotexture of the liver suggests diffuse underlying leiomyomatous
change as well.

4.  No subchorionic hemorrhage evident.

## 2022-03-26 DIAGNOSIS — A084 Viral intestinal infection, unspecified: Secondary | ICD-10-CM | POA: Diagnosis not present

## 2023-04-27 DIAGNOSIS — K047 Periapical abscess without sinus: Secondary | ICD-10-CM | POA: Diagnosis not present

## 2023-05-23 DIAGNOSIS — N3000 Acute cystitis without hematuria: Secondary | ICD-10-CM | POA: Diagnosis not present

## 2023-05-23 DIAGNOSIS — J029 Acute pharyngitis, unspecified: Secondary | ICD-10-CM | POA: Diagnosis not present

## 2023-05-23 DIAGNOSIS — R8290 Unspecified abnormal findings in urine: Secondary | ICD-10-CM | POA: Diagnosis not present

## 2023-05-23 DIAGNOSIS — J069 Acute upper respiratory infection, unspecified: Secondary | ICD-10-CM | POA: Diagnosis not present

## 2023-05-30 ENCOUNTER — Ambulatory Visit (INDEPENDENT_AMBULATORY_CARE_PROVIDER_SITE_OTHER): Payer: BC Managed Care – PPO

## 2023-05-30 ENCOUNTER — Ambulatory Visit
Admission: EM | Admit: 2023-05-30 | Discharge: 2023-05-30 | Disposition: A | Discharge status: Home / Self Care | Payer: BC Managed Care – PPO

## 2023-05-30 DIAGNOSIS — R058 Other specified cough: Secondary | ICD-10-CM | POA: Diagnosis not present

## 2023-05-30 DIAGNOSIS — J069 Acute upper respiratory infection, unspecified: Secondary | ICD-10-CM

## 2023-05-30 DIAGNOSIS — R051 Acute cough: Secondary | ICD-10-CM

## 2023-05-30 MED ORDER — BENZONATATE 100 MG PO CAPS: 100.0000 mg | ORAL_CAPSULE | Freq: Three times a day (TID) | ORAL | 0 refills | Status: AC | PRN

## 2023-05-30 NOTE — ED Notes (Signed)
Appointment scheduled for patient to establish pcp.

## 2023-05-30 NOTE — ED Provider Notes (Signed)
Renaldo Fiddler    CSN: [id number] Arrival date & time: 05/30/23  1727      History   Chief Complaint Chief Complaint  Patient presents with   Cough    HPI Mariah Lee is a 47 y.o. female.  Patient presents with 1 week history of fatigue, congestion, sore throat, cough.  No fever, shortness of breath, chest pain, or other symptoms.  No OTC medications taken today.  [facility]; diagnosed with acute cystitis, sore throat, cloudy urine, viral URI; treated with Keflex; her urine color sure grew greater than 100,000 colonies of Klebsiella pneumoniae which was susceptible to the medication.  Her urine symptoms have resolved.  Patient was seen at [facility] on 04/27/2023; diagnosed with infected tooth; treated with amoxicillin and naproxen.  The history is provided by the patient and medical records.    History reviewed. No pertinent past medical history.  There are no problems to display for this patient.   History reviewed. No pertinent surgical history.  OB History     Gravida  1   Para      Term      Preterm      AB      Living         SAB      IAB      Ectopic      Multiple      Live Births               Home Medications    Prior to Admission medications   Medication Sig Start Date End Date Taking? Authorizing Provider  benzonatate (TESSALON) 100 MG capsule Take 1 capsule (100 mg total) by mouth 3 (three) times daily as needed for cough. 05/30/23  Yes Mickie Bail, NP  aspirin EC 81 MG tablet Take 81 mg by mouth daily. Swallow whole. Patient not taking: Reported on 05/30/2023    [provider]  cephALEXin (KEFLEX) 500 MG capsule Take by mouth. Patient not taking: Reported on 05/30/2023 05/23/23 05/30/23  [provider]  cyanocobalamin 100 MCG tablet Take by mouth.    [provider]  Multiple Vitamin (MULTI-VITAMIN) tablet Take 1 tablet by mouth daily.    [provider]   vitamin E 180 MG (400 UNITS) capsule Take by mouth.    [provider]    Family History Family History  Problem Relation Age of Onset   Breast cancer Paternal Aunt    Breast cancer Paternal Grandmother    Breast cancer Paternal Aunt    AAA (abdominal aortic aneurysm) Mother     Social History Social History   Tobacco Use   Smoking status: Never   Smokeless tobacco: Never  Substance Use Topics   Alcohol use: Not Currently   Drug use: Never     Allergies   Ciprofloxacin, Clindamycin, and Keflex [cephalexin]   Review of Systems Review of Systems  Constitutional:  Positive for fatigue. Negative for chills and fever.  HENT:  Positive for congestion and sore throat. Negative for ear pain.   Respiratory:  Positive for cough. Negative for shortness of breath.   Cardiovascular:  Negative for chest pain and palpitations.  Gastrointestinal:  Negative for abdominal pain, diarrhea and vomiting.  Genitourinary:  Negative for dysuria and hematuria.     Physical Exam Triage Vital Signs ED Triage Vitals  Encounter Vitals Group     BP 05/30/23 1801 (!) 130/91     Systolic  BP Percentile --      Diastolic BP Percentile --      Pulse Rate 05/30/23 1753 83     Resp 05/30/23 1753 18     Temp 05/30/23 1753 98.8 F (37.1 C)     Temp src --      SpO2 05/30/23 1753 99 %     Weight --      Height --      Head Circumference --      Peak Flow --      Pain Score 05/30/23 1758 5     Pain Loc --      Pain Education --      Exclude from Growth Chart --    No data found.  Updated Vital Signs BP (!) 130/91   Pulse 83   Temp 98.8 F (37.1 C)   Resp 18   LMP 05/27/2023   SpO2 99%   Visual Acuity Right Eye Distance:   Left Eye Distance:   Bilateral Distance:    Right Eye Near:   Left Eye Near:    Bilateral Near:     Physical Exam Constitutional:      General: She is not in acute distress. HENT:     Right Ear: Tympanic membrane normal.     Left Ear: Tympanic  membrane normal.     Nose: Nose normal.     Mouth/Throat:     Mouth: Mucous membranes are moist.     Pharynx: Oropharynx is clear.  Cardiovascular:     Rate and Rhythm: Normal rate and regular rhythm.     Heart sounds: Normal heart sounds.  Pulmonary:     Effort: Pulmonary effort is normal. No respiratory distress.     Breath sounds: Normal breath sounds. No wheezing, rhonchi or rales.  Skin:    General: Skin is warm and dry.  Neurological:     Mental Status: She is alert.  Psychiatric:        Mood and Affect: Mood normal.        Behavior: Behavior normal.      UC Treatments / Results  Labs (all labs ordered are listed, but only abnormal results are displayed) Labs Reviewed - No data to display  EKG   Radiology DG Chest 2 View  Result Date: 05/30/2023 CLINICAL DATA:  Productive cough. EXAM: CHEST - 2 VIEW COMPARISON:  None Available. FINDINGS: The heart size and mediastinal contours are within normal limits. Both lungs are clear. The visualized skeletal structures are unremarkable. IMPRESSION: No active cardiopulmonary disease. Electronically Signed   By: Aram Candela M.D.   On: 05/30/2023 19:40    Procedures Procedures (including critical care time)  Medications Ordered in UC Medications - No data to display  Initial Impression / Assessment and Plan / UC Course  I have reviewed the triage vital signs and the nursing notes.  Pertinent labs & imaging results that were available during my care of the patient were reviewed by me and considered in my medical decision making (see chart for details).    Cough, viral URI.  No respiratory distress, lungs are clear, O2 sat 99% on room air.  Chest x-ray negative.  Treating cough with Tessalon Perles.  Patient does not currently have a PCP.  Our nurse scheduled her with an appointment to establish a PCP at [facility].  ED precautions given.  Education provided on cough.  Patient agrees to plan of care.  Final Clinical  Impressions(s) / UC Diagnoses  Final diagnoses:  Acute cough  Viral URI     Discharge Instructions      Take the Tessalon Perles for cough.  Follow up with your primary care provider on Monday.  Go to the emergency department if you have worsening symptoms.        ED Prescriptions     Medication Sig Dispense Auth. Provider   benzonatate (TESSALON) 100 MG capsule Take 1 capsule (100 mg total) by mouth 3 (three) times daily as needed for cough. 21 capsule Mickie Bail, NP      PDMP not reviewed this encounter.   Mickie Bail, NP 05/30/23 [phone]

## 2023-05-30 NOTE — ED Triage Notes (Signed)
Patient to Urgent Care with complaints of fatigue/ cough/ sore throat. Reports cough is productive. Denies any recent fevers.  Seen at [facility] and completed Keflex.   Reports symptoms started over 1 week ago.

## 2023-05-30 NOTE — Discharge Instructions (Addendum)
Take the [facility] for cough.  Follow up with your primary care provider on Monday.  Go to the emergency department if you have worsening symptoms.

## 2023-06-27 ENCOUNTER — Ambulatory Visit: Payer: BC Managed Care – PPO | Admitting: Family Medicine

## 2023-08-04 ENCOUNTER — Ambulatory Visit: Payer: BC Managed Care – PPO | Admitting: Family Medicine

## 2024-08-07 ENCOUNTER — Other Ambulatory Visit: Payer: Self-pay

## 2024-08-07 ENCOUNTER — Emergency Department

## 2024-08-07 ENCOUNTER — Emergency Department
Admission: EM | Admit: 2024-08-07 | Discharge: 2024-08-07 | Disposition: A | Discharge status: Home / Self Care | Attending: Emergency Medicine | Admitting: Emergency Medicine

## 2024-08-07 DIAGNOSIS — R600 Localized edema: Secondary | ICD-10-CM | POA: Diagnosis not present

## 2024-08-07 DIAGNOSIS — R079 Chest pain, unspecified: Secondary | ICD-10-CM | POA: Diagnosis not present

## 2024-08-07 DIAGNOSIS — M545 Low back pain, unspecified: Secondary | ICD-10-CM | POA: Insufficient documentation

## 2024-08-07 DIAGNOSIS — D649 Anemia, unspecified: Secondary | ICD-10-CM | POA: Insufficient documentation

## 2024-08-07 DIAGNOSIS — R0789 Other chest pain: Secondary | ICD-10-CM | POA: Diagnosis not present

## 2024-08-07 DIAGNOSIS — M7989 Other specified soft tissue disorders: Secondary | ICD-10-CM | POA: Insufficient documentation

## 2024-08-07 LAB — TROPONIN T, HIGH SENSITIVITY: Troponin T High Sensitivity: <15 ng/L (ref 0–19)

## 2024-08-07 LAB — CBC WITH DIFFERENTIAL/PLATELET
Abs Immature Granulocytes: 0.02 K/uL (ref 0.00–0.07)
Basophils Absolute: 0.1 K/uL (ref 0.0–0.1)
Basophils Relative: 1 %
Eosinophils Absolute: 0.3 K/uL (ref 0.0–0.5)
Eosinophils Relative: 4 %
HCT: 26.4 % — ABNORMAL LOW (ref 36.0–46.0)
Hemoglobin: 7.4 g/dL — ABNORMAL LOW (ref 12.0–15.0)
Immature Granulocytes: 0 %
Lymphocytes Relative: 42 %
Lymphs Abs: 2.9 K/uL (ref 0.7–4.0)
MCH: 19.3 pg — ABNORMAL LOW (ref 26.0–34.0)
MCHC: 28 g/dL — ABNORMAL LOW (ref 30.0–36.0)
MCV: 68.8 fL — ABNORMAL LOW (ref 80.0–100.0)
Monocytes Absolute: 0.5 K/uL (ref 0.1–1.0)
Monocytes Relative: 7 %
Neutro Abs: 3.1 K/uL (ref 1.7–7.7)
Neutrophils Relative %: 46 %
Platelets: 458 K/uL — ABNORMAL HIGH (ref 150–400)
RBC: 3.84 MIL/uL — ABNORMAL LOW (ref 3.87–5.11)
RDW: 18 % — ABNORMAL HIGH (ref 11.5–15.5)
Smear Review: NORMAL
WBC: 6.8 K/uL (ref 4.0–10.5)
nRBC: 0 % (ref 0.0–0.2)

## 2024-08-07 LAB — CBC
HCT: 26.6 % — ABNORMAL LOW (ref 36.0–46.0)
Hemoglobin: 7.5 g/dL — ABNORMAL LOW (ref 12.0–15.0)
MCH: 19.4 pg — ABNORMAL LOW (ref 26.0–34.0)
MCHC: 28.2 g/dL — ABNORMAL LOW (ref 30.0–36.0)
MCV: 68.9 fL — ABNORMAL LOW (ref 80.0–100.0)
Platelets: 454 K/uL — ABNORMAL HIGH (ref 150–400)
RBC: 3.86 MIL/uL — ABNORMAL LOW (ref 3.87–5.11)
RDW: 18 % — ABNORMAL HIGH (ref 11.5–15.5)
WBC: 6.9 K/uL (ref 4.0–10.5)
nRBC: 0 % (ref 0.0–0.2)

## 2024-08-07 LAB — POC URINE PREG, ED: Preg Test, Ur: NEGATIVE

## 2024-08-07 LAB — BASIC METABOLIC PANEL WITH GFR
Anion gap: 11 (ref 5–15)
BUN: 13 mg/dL (ref 6–20)
CO2: 23 mmol/L (ref 22–32)
Calcium: 8.9 mg/dL (ref 8.9–10.3)
Chloride: 105 mmol/L (ref 98–111)
Creatinine, Ser: 0.74 mg/dL (ref 0.44–1.00)
GFR, Estimated: >60 mL/min (ref >60)
Glucose, Bld: 118 mg/dL — ABNORMAL HIGH (ref 70–99)
Potassium: 3.8 mmol/L (ref 3.5–5.1)
Sodium: 138 mmol/L (ref 135–145)

## 2024-08-07 LAB — D-DIMER, QUANTITATIVE: D-Dimer, Quant: 0.43 ug{FEU}/mL (ref 0.00–0.50)

## 2024-08-07 LAB — PRO BRAIN NATRIURETIC PEPTIDE: Pro Brain Natriuretic Peptide: <50 pg/mL (ref <300.0)

## 2024-08-07 LAB — SALICYLATE LEVEL: Salicylate Lvl: 11.1 mg/dL (ref 7.0–30.0)

## 2024-08-07 NOTE — ED Provider Notes (Signed)
 Mariah Lee Provider Note    Event Date/Time   First MD Initiated Contact with Patient 08/07/24 2153     (approximate)   History   Chest Pain   HPI  Mariah Lee is a 48 y.o. female here for leg swelling.  States she has noted some left leg aching for months.  States that it is slightly swollen, knows the right swollen as well.  States she travels frequently and is concerned about blood clots.  States that when her leg was aching, she also felt pain that went up to her chest.  That went away and she is asymptomatic at this time.  Denies any new cough, no shortness of breath, no fever.  No focal weakness or numbness, states that she is chronic lower back pain but no incontinence or saddle anesthesia.  States that she does get heavy menstrual bleeding due to uterine fibroid.  Denies any hematochezia or melena.  On independent chart reviews, no hormones listed in med list, no prior cardiac imaging or ultrasound of her lower extremities.     Physical Exam   Triage Vital Signs: ED Triage Vitals  Encounter Vitals Group     BP 08/07/24 2114 (!) 140/84     Girls Systolic BP Percentile --      Girls Diastolic BP Percentile --      Boys Systolic BP Percentile --      Boys Diastolic BP Percentile --      Pulse Rate 08/07/24 2114 78     Resp 08/07/24 2114 16     Temp 08/07/24 2114 98.6 F (37 C)     Temp Source 08/07/24 2114 Oral     SpO2 08/07/24 2114 100 %     Weight --      Height --      Head Circumference --      Peak Flow --      Pain Score 08/07/24 2116 3     Pain Loc --      Pain Education --      Exclude from Growth Chart --     Most recent vital signs: Vitals:   08/07/24 2114  BP: (!) 140/84  Pulse: 78  Resp: 16  Temp: 98.6 F (37 C)  SpO2: 100%     General: Awake, no distress.  CV:  Good peripheral perfusion.  Resp:  Normal effort.  No tachypnea or respiratory distress Abd:  No distention.  Soft  nontender Other:  Nontoxic-appearing, palpable DP pulses bilaterally, trace edema bilaterally without unilateral calf sign or tenderness, no tenderness to her lower extremities.  No midline spinal tenderness.  No focal weakness or numbness.  No saddle anesthesia.   ED Results / Procedures / Treatments   Labs (all labs ordered are listed, but only abnormal results are displayed) Labs Reviewed  BASIC METABOLIC PANEL WITH GFR - Abnormal; Notable for the following components:      Result Value   Glucose, Bld 118 (*)    All other components within normal limits  CBC - Abnormal; Notable for the following components:   RBC 3.86 (*)    Hemoglobin 7.5 (*)    HCT 26.6 (*)    MCV 68.9 (*)    MCH 19.4 (*)    MCHC 28.2 (*)    RDW 18.0 (*)    Platelets 454 (*)    All other components within normal limits  CBC WITH DIFFERENTIAL/PLATELET - Abnormal; Notable for the following components:  RBC 3.84 (*)    Hemoglobin 7.4 (*)    HCT 26.4 (*)    MCV 68.8 (*)    MCH 19.3 (*)    MCHC 28.0 (*)    RDW 18.0 (*)    Platelets 458 (*)    All other components within normal limits  SALICYLATE LEVEL  D-DIMER, QUANTITATIVE  PRO BRAIN NATRIURETIC PEPTIDE  HCG, QUANTITATIVE, PREGNANCY  TROPONIN T, HIGH SENSITIVITY     EKG  EKG shows, sinus tach, rate 70, normal QS, normal QTc, no obvious ischemic ST elevation, no prior to compare   RADIOLOGY On my independent interpretation, chest x-ray without obvious consolidation   PROCEDURES:  Critical Care performed: No  Procedures   MEDICATIONS ORDERED IN ED: Medications - No data to display   IMPRESSION / MDM / ASSESSMENT AND PLAN / ED COURSE  I reviewed the triage vital signs and the nursing notes.                              Differential diagnosis includes, but is not limited to, angina, atypical ACS, musculoskeletal pain, strain, CHF, DVT, lymphedema, did consider PE the patient is on anticoagulation, no hypoxia or tachycardia, did have a  long drive here, get a D-dimer.  Did consider dissection but history sounds less consistent with this, symptoms were short, resolved, she is nontoxic-appearing, not overtly hypertensive, no focal weakness or numbness, equal DP pulses bilaterally.  Labs, EKG, troponin, chest x-ray, DVT ultrasound.  Patient's presentation is most consistent with acute presentation with potential threat to life or bodily function.  Independent interpretation of labs and imaging below.  Clinical course as below.  Patient states that she has not been sexually active for the past year, unlikely to be pregnant.  Did discuss her about imaging and lab results including incidental findings of the anemia.  This is likely from her heavy periods, she denies any evidence of GI bleed.  Will give her a number to call for OB/GYN follow-up to discuss possible OCPs.  Otherwise considered but no indication for inpatient admission at this time, she safe for outpatient management.  Shared decision making patient and she is agreeable with this plan.  Discharge.  Strict return precautions given.    Clinical Course as of 08/07/24 2316  Sat Aug 07, 2024  2205 DG Chest 2 View No active cardiopulmonary disease.  [TT]  2223 Independent review of labs, troponins not elevated, electrolytes not severely deranged, no leukocytosis, hemoglobin is 7.5, this is down compared to prior.  Repeat hemoglobin is still 7.4.  She denies any hematochezia or melena, does have heavy menses, did consider anemia from her heavy periods.  Did discuss with her about following up with OB/GYN to discuss possibly starting OCPs to further manage her heavy periods.  She is agreeable with this plan. [TT]  2251 For the rest of her labs, BNP is not elevated, D-dimer is not elevated. [TT]  2309 US  Venous Img Lower Bilateral 1. No evidence of DVT.  [TT]    Clinical Course User Index [TT] Waymond, Lorelle Cummins, MD     FINAL CLINICAL IMPRESSION(S) / ED DIAGNOSES   Final  diagnoses:  Chest pain, unspecified type  Leg swelling  Anemia, unspecified type     Rx / DC Orders   ED Discharge Orders     None        Note:  This document was prepared using Dragon voice recognition software  and may include unintentional dictation errors.    Waymond Lorelle Cummins, MD 08/07/24 [phone]

## 2024-08-07 NOTE — Discharge Instructions (Signed)
 For anemia, this is likely from your heavy periods.  I put in another number for you to call for OB/GYN to schedule follow-up.  Please return to follow-up with your primary care doctor for further management of your symptoms and to get reassessed.  Your ultrasound did not show a blood clot.

## 2024-08-07 NOTE — ED Triage Notes (Signed)
 Pt presents via POV c/o chest pain and bilateral leg pain. Reports has had pain in LLE for months and now has pain in the right leg. Reports recent car travel to [place] so concerned for a blood clot. A&O x4. Ambulatory to triage.

## 2024-08-08 LAB — HCG, QUANTITATIVE, PREGNANCY: hCG, Beta Chain, Quant, S: <1 m[IU]/mL (ref <5)
